# Patient Record
Sex: Male | Born: 1960 | Race: White | Hispanic: No | Marital: Single | Smoking: Current every day smoker
Health system: Southern US, Community
[De-identification: ages and names within clinical notes are randomized; demographics above are authoritative.]

## PROBLEM LIST (undated history)

## (undated) DIAGNOSIS — I10 Essential (primary) hypertension: Secondary | ICD-10-CM

---

## 2005-02-11 ENCOUNTER — Emergency Department (HOSPITAL_COMMUNITY): Admission: EM | Admit: 2005-02-11 | Discharge: 2005-02-11 | Payer: Self-pay | Admitting: Emergency Medicine

## 2017-06-18 ENCOUNTER — Emergency Department (HOSPITAL_BASED_OUTPATIENT_CLINIC_OR_DEPARTMENT_OTHER)
Admission: EM | Admit: 2017-06-18 | Discharge: 2017-06-18 | Disposition: A | Discharge status: Home / Self Care | Payer: Self-pay | Attending: Emergency Medicine | Admitting: Emergency Medicine

## 2017-06-18 ENCOUNTER — Emergency Department (HOSPITAL_BASED_OUTPATIENT_CLINIC_OR_DEPARTMENT_OTHER): Payer: Self-pay

## 2017-06-18 ENCOUNTER — Encounter (HOSPITAL_BASED_OUTPATIENT_CLINIC_OR_DEPARTMENT_OTHER): Payer: Self-pay | Admitting: Emergency Medicine

## 2017-06-18 ENCOUNTER — Other Ambulatory Visit: Payer: Self-pay

## 2017-06-18 DIAGNOSIS — R52 Pain, unspecified: Secondary | ICD-10-CM | POA: Insufficient documentation

## 2017-06-18 DIAGNOSIS — Z79899 Other long term (current) drug therapy: Secondary | ICD-10-CM | POA: Insufficient documentation

## 2017-06-18 DIAGNOSIS — J209 Acute bronchitis, unspecified: Secondary | ICD-10-CM | POA: Insufficient documentation

## 2017-06-18 DIAGNOSIS — F17210 Nicotine dependence, cigarettes, uncomplicated: Secondary | ICD-10-CM | POA: Insufficient documentation

## 2017-06-18 MED ORDER — ALBUTEROL SULFATE HFA 108 (90 BASE) MCG/ACT IN AERS
1.0000 | INHALATION_SPRAY | RESPIRATORY_TRACT | Status: DC
  Administered 2017-06-18: 2 via RESPIRATORY_TRACT
  Filled 2017-06-18: qty 6.7

## 2017-06-18 MED ORDER — ALBUTEROL SULFATE HFA 108 (90 BASE) MCG/ACT IN AERS: 1.0000 | INHALATION_SPRAY | Freq: Four times a day (QID) | RESPIRATORY_TRACT | 0 refills | Status: AC | PRN

## 2017-06-18 MED ORDER — DOXYCYCLINE HYCLATE 100 MG PO CAPS: 100.0000 mg | ORAL_CAPSULE | Freq: Two times a day (BID) | ORAL | 0 refills | Status: DC

## 2017-06-18 MED ORDER — PREDNISONE 10 MG (21) PO TBPK: ORAL_TABLET | Freq: Every day | ORAL | 0 refills | Status: DC

## 2017-06-18 MED FILL — DOXYCYCLINE HYCLATE 100 MG: 100 | 10 days supply | Qty: 20 | Fill #0

## 2017-06-18 MED FILL — predniSONE 10 MG TABS: 10 | 12 days supply | Qty: 42 | Fill #0

## 2017-06-18 NOTE — ED Notes (Signed)
Pt returned from xray

## 2017-06-18 NOTE — ED Notes (Signed)
Patient transported to X-ray 

## 2017-06-18 NOTE — ED Notes (Signed)
ED Provider at bedside. 

## 2017-06-18 NOTE — Discharge Instructions (Signed)
Chest x-ray was normal. Symptoms likely from viral illness possibly influenza.  Given history of cigarette use and wheezing on exam this likely cause bronchitis. Take medications as prescribed.  The main treatment approach for a viral upper respiratory infection and/or influenza is to treat the symptoms, support your immune system and prevent spread of illness. Stay well-hydrated. Rest. Take ibuprofen or Tylenol around the clock to help with associated fevers, sore throat, headaches, generalized body aches and malaise. Use an over-the-counter nasal steroid spray (like Flonase) to help with nasal congestion, runny nose and postnasal drip.  Wash your hands often to prevent spread.  A viral upper respiratory infection and/or influenza typically lasts 5-7 days.  Symptoms resolve slowly.  However, a viral upper respiratory infection can also worsen and progress into pneumonia.  Monitor your symptoms.  Return to the emergency department if your symptoms worsen, persist or if you develop chest pain, increased work of breathing

## 2017-06-18 NOTE — ED Triage Notes (Signed)
Pt having nasal congestion, sneezing, cough, throat irritation, ear pressure since Friday.

## 2017-06-18 NOTE — ED Notes (Signed)
Pt verbalizes understanding of d/c instructions and denies any further needs at this time. 

## 2017-06-18 NOTE — ED Notes (Signed)
Pt c/o general URI symptoms that have gone unrelieved by one dose of each of the various OTC cold medications.  Pt is a heavy smoker as well, is unsure as to exactly his last dose of what medication and at what time.

## 2017-06-18 NOTE — ED Provider Notes (Signed)
[facility] EMERGENCY DEPARTMENT Provider Note   CSN: [id number] Arrival date & time: 06/18/17  1536     History   Chief Complaint Chief Complaint  Patient presents with  . URI    HPI Ronald Ho is a 57 y.o. male history of tobacco abuse is here for evaluation of generalized body aches, nasal congestion, scratchy throat, cough with sputum production for 5 days. Has been taken TheraFlu, Alka-Seltzer for symptoms without relief. Denies fevers or chills, chest pain, shortness of breath, increased work of breathing, wheezing, nausea, vomiting, abdominal pain, flank pain, urinary symptoms, changes in bowel movements. No known sick contacts. He smokes one half pack of cigarettes a day for a long time. Denies previous diagnosis of lung disease such as asthma or COPD. No history of significant immunosuppression.  HPI  No past medical history on file.  There are no active problems to display for this patient.   ** The histories are not reviewed yet. Please review them in the "History" navigator section and refresh this SmartLink.     Home Medications    Prior to Admission medications   Medication Sig Start Date End Date Taking? Authorizing Provider  albuterol (PROVENTIL HFA;VENTOLIN HFA) 108 (90 Base) MCG/ACT inhaler Inhale 1-2 puffs into the lungs every 6 (six) hours as needed for wheezing or shortness of breath. 06/18/17   Liberty HandyGibbons, Jerome Viglione J, PA-C  doxycycline (VIBRAMYCIN) 100 MG capsule Take 1 capsule (100 mg total) by mouth 2 (two) times daily. 06/18/17   Liberty HandyGibbons, Alfonsa Vaile J, PA-C  predniSONE (STERAPRED UNI-PAK 21 TAB) 10 MG (21) TBPK tablet Take by mouth daily. Take 6 tabs by mouth daily  for 2 days, then 5 tabs for 2 days, then 4 tabs for 2 days, then 3 tabs for 2 days, 2 tabs for 2 days, then 1 tab by mouth daily for 2 days 06/18/17   Liberty HandyGibbons, Dashiell Franchino J, PA-C    Family History No family history on file.  Social History Social History   Tobacco Use  . Smoking status:  Current Every Day Smoker  . Smokeless tobacco: Never Used  Substance Use Topics  . Alcohol use: Not on file  . Drug use: Not on file     Allergies   Patient has no known allergies.   Review of Systems Review of Systems  HENT: Positive for congestion, postnasal drip, rhinorrhea and sore throat.   Respiratory: Positive for cough.   Musculoskeletal: Positive for myalgias.  All other systems reviewed and are negative.    Physical Exam Updated Vital Signs BP (!) 145/99 (BP Location: Right Arm)   Pulse 77   Temp 98.2 F (36.8 C) (Oral)   Resp 18   Ht 6\' 4"  (1.93 m)   Wt 76.2 kg (168 lb)   SpO2 100%   BMI 20.45 kg/m   Physical Exam  Constitutional: He is oriented to person, place, and time. He appears well-developed and well-nourished. No distress.  NAD. Nontoxic.  HENT:  Head: Normocephalic and atraumatic.  Right Ear: External ear normal.  Left Ear: External ear normal.  Nose: Nose normal.  Oropharynx is mildly erythematous. Tonsils without hypertrophy, or erythema, exudates. Uvula is midline. Moist membranes. Intranasal mucosa normal. Tympanic members are normal.  Eyes: Conjunctivae and EOM are normal. No scleral icterus.  Neck: Normal range of motion. Neck supple.  No cervical adenopathy  Cardiovascular: Normal rate, regular rhythm, normal heart sounds and intact distal pulses.  No murmur heard. No LE edema or calf tenderness.  Pulmonary/Chest: Effort normal. He has wheezes.  Faint expiratory wheezing to left lower lobe posteriorly. Normal work of breathing.  Abdominal: Soft. There is no tenderness.  Musculoskeletal: Normal range of motion. He exhibits no deformity.  Neurological: He is alert and oriented to person, place, and time.  Skin: Skin is warm and dry. Capillary refill takes less than 2 seconds.  Psychiatric: He has a normal mood and affect. His behavior is normal. Judgment and thought content normal.  Nursing note and vitals reviewed.    ED  Treatments / Results  Labs (all labs ordered are listed, but only abnormal results are displayed) Labs Reviewed - No data to display  EKG  EKG Interpretation None       Radiology Dg Chest 2 View  Result Date: 06/18/2017 CLINICAL DATA:  57 year old male with productive cough wheezing body ache and fatigue for 4 days. Smoker. EXAM: CHEST - 2 VIEW COMPARISON:  Chest radiographs 02/04/2017 and earlier. FINDINGS: Stable large lung volumes. Mediastinal contours are stable and within normal limits. No pneumothorax, pulmonary edema, pleural effusion or confluent pulmonary opacity. Visualized tracheal air column is within normal limits. No acute osseous abnormality identified. Negative visible bowel gas pattern. IMPRESSION: Chronic pulmonary hyperinflation. No acute cardiopulmonary abnormality. Electronically Signed   By: [name] M.D.   On: 06/18/2017 16:34    Procedures Procedures (including critical care time)  Medications Ordered in ED Medications  albuterol (PROVENTIL HFA;VENTOLIN HFA) 108 (90 Base) MCG/ACT inhaler 1-2 puff (2 puffs Inhalation Given 06/18/17 1631)     Initial Impression / Assessment and Plan / ED Course  I have reviewed the triage vital signs and the nursing notes.  Pertinent labs & imaging results that were available during my care of the patient were reviewed by me and considered in my medical decision making (see chart for details).    57 y.o. -year-old male here with URI symptoms and cough x 5 days. On my exam patient is nontoxic appearing, speaking in full sentences, w/o increased WOB. No fever, tachypnea, tachycardia, hypoxia. He has focal wheezing to LLL on exam. CXR without infiltrate or edema. Given long h/o tobacco abuse, cough with sputum production will tx for COPD exacerbation/bronchitis. History and exam not consistent with CHF. Given reassuring physical exam, will discharge with symptomatic treatment, prednisone, albuterol, doxycyline. He is outside tamiflu  window. Strict ED return precautions given. Patient is aware of red flag symptoms to monitor for that would warrant return to the ED for further reevaluation.    Final Clinical Impressions(s) / ED Diagnoses   Final diagnoses:  Acute bronchitis, unspecified organism    ED Discharge Orders        Ordered    predniSONE (STERAPRED UNI-PAK 21 TAB) 10 MG (21) TBPK tablet  Daily     06/18/17 1659    doxycycline (VIBRAMYCIN) 100 MG capsule  2 times daily     06/18/17 1659    albuterol (PROVENTIL HFA;VENTOLIN HFA) 108 (90 Base) MCG/ACT inhaler  Every 6 hours PRN     06/18/17 1659       Liberty Handy, PA-C 06/18/17 1736    Arby Barrette, MD 06/21/17 [phone]

## 2017-10-14 ENCOUNTER — Encounter (HOSPITAL_BASED_OUTPATIENT_CLINIC_OR_DEPARTMENT_OTHER): Payer: Self-pay | Admitting: *Deleted

## 2017-10-14 ENCOUNTER — Emergency Department (HOSPITAL_BASED_OUTPATIENT_CLINIC_OR_DEPARTMENT_OTHER): Payer: Self-pay

## 2017-10-14 ENCOUNTER — Other Ambulatory Visit: Payer: Self-pay

## 2017-10-14 ENCOUNTER — Emergency Department (HOSPITAL_BASED_OUTPATIENT_CLINIC_OR_DEPARTMENT_OTHER)
Admission: EM | Admit: 2017-10-14 | Discharge: 2017-10-14 | Disposition: A | Discharge status: Home / Self Care | Payer: Self-pay | Attending: Emergency Medicine | Admitting: Emergency Medicine

## 2017-10-14 DIAGNOSIS — F17200 Nicotine dependence, unspecified, uncomplicated: Secondary | ICD-10-CM | POA: Insufficient documentation

## 2017-10-14 DIAGNOSIS — R0789 Other chest pain: Secondary | ICD-10-CM | POA: Insufficient documentation

## 2017-10-14 DIAGNOSIS — Z79899 Other long term (current) drug therapy: Secondary | ICD-10-CM | POA: Insufficient documentation

## 2017-10-14 MED ORDER — OXYCODONE-ACETAMINOPHEN 5-325 MG PO TABS
1.0000 | ORAL_TABLET | Freq: Once | ORAL | Status: AC
  Administered 2017-10-14: 1 via ORAL
  Filled 2017-10-14: qty 1

## 2017-10-14 MED ORDER — IBUPROFEN 400 MG PO TABS
600.0000 mg | ORAL_TABLET | Freq: Once | ORAL | Status: AC
  Administered 2017-10-14: 600 mg via ORAL
  Filled 2017-10-14: qty 1

## 2017-10-14 NOTE — Discharge Instructions (Addendum)
Your x-rays are normal. Take ibuprofen 600 mg every 6 hours as needed for pain.

## 2017-10-14 NOTE — ED Provider Notes (Signed)
[facility] EMERGENCY DEPARTMENT Provider Note   CSN: [id number] Arrival date & time: 10/14/17  1916     History   Chief Complaint No chief complaint on file.   HPI Ronald Ho is a 57 y.o. male.  HPI   56yM with L CP. Onset 3d ago. Was blowing nose when he felt a pop in L chest and has had persistent pain since. Pain is in L anterior chest with radiation to back/shoulder. Constant. Worse with pressure in area and with movement. Does not feel SOB. No change in cough. No unusual leg pain or swelling.   History reviewed. No pertinent past medical history.  There are no active problems to display for this patient.   History reviewed. No pertinent surgical history.      Home Medications    Prior to Admission medications   Medication Sig Start Date End Date Taking? Authorizing Provider  albuterol (PROVENTIL HFA;VENTOLIN HFA) 108 (90 Base) MCG/ACT inhaler Inhale 1-2 puffs into the lungs every 6 (six) hours as needed for wheezing or shortness of breath. 06/18/17   Liberty HandyGibbons, Claudia J, PA-C  doxycycline (VIBRAMYCIN) 100 MG capsule Take 1 capsule (100 mg total) by mouth 2 (two) times daily. 06/18/17   Liberty HandyGibbons, Claudia J, PA-C  predniSONE (STERAPRED UNI-PAK 21 TAB) 10 MG (21) TBPK tablet Take by mouth daily. Take 6 tabs by mouth daily  for 2 days, then 5 tabs for 2 days, then 4 tabs for 2 days, then 3 tabs for 2 days, 2 tabs for 2 days, then 1 tab by mouth daily for 2 days 06/18/17   Liberty HandyGibbons, Claudia J, PA-C    Family History No family history on file.  Social History Social History   Tobacco Use  . Smoking status: Current Every Day Smoker  . Smokeless tobacco: Never Used  Substance Use Topics  . Alcohol use: Not on file  . Drug use: Not on file     Allergies   Patient has no known allergies.   Review of Systems Review of Systems  All systems reviewed and negative, other than as noted in HPI.  Physical Exam Updated Vital Signs BP (!) 118/91   Pulse 90    Temp 98.3 F (36.8 C) (Oral)   Resp 20   Ht 6\' 4"  (1.93 m)   Wt 74.8 kg (165 lb)   SpO2 98%   BMI 20.08 kg/m   Physical Exam  Constitutional: He appears well-developed and well-nourished. No distress.  HENT:  Head: Normocephalic and atraumatic.  Eyes: Conjunctivae are normal. Right eye exhibits no discharge. Left eye exhibits no discharge.  Neck: Neck supple.  Cardiovascular: Normal rate, regular rhythm and normal heart sounds. Exam reveals no gallop and no friction rub.  No murmur heard. Pulmonary/Chest: Effort normal and breath sounds normal. No respiratory distress. He exhibits tenderness.  Point tender in pictured area. No crepitus. No overlying skin changes.     Abdominal: Soft. He exhibits no distension. There is no tenderness.  Musculoskeletal: He exhibits no edema or tenderness.  Lower extremities symmetric as compared to each other. No calf tenderness. Negative Homan's. No palpable cords.   Neurological: He is alert.  Skin: Skin is warm and dry.  Psychiatric: He has a normal mood and affect. His behavior is normal. Thought content normal.  Nursing note and vitals reviewed.    ED Treatments / Results  Labs (all labs ordered are listed, but only abnormal results are displayed) Labs Reviewed - No data to display  EKG None  Radiology No results found.  Procedures Procedures (including critical care time)  Medications Ordered in ED Medications - No data to display   Initial Impression / Assessment and Plan / ED Course  I have reviewed the triage vital signs and the nursing notes.  Pertinent labs & imaging results that were available during my care of the patient were reviewed by me and considered in my medical decision making (see chart for details).    56yM with symptoms consistent with chest wall pain. Very reproducible. Atypical for ACS. I doubt PE or dissection. CXR w/o acute findings. Symptomatic tx.   Final Clinical Impressions(s) / ED Diagnoses    Final diagnoses:  Chest wall pain    ED Discharge Orders    None       Raeford Razor, MD 10/21/17 [phone]

## 2017-10-14 NOTE — ED Triage Notes (Signed)
3 days ago he blew his nose and felt a pop in his left ribs and pain in his shoulder.

## 2017-11-27 ENCOUNTER — Other Ambulatory Visit: Payer: Self-pay

## 2017-11-27 ENCOUNTER — Emergency Department (HOSPITAL_BASED_OUTPATIENT_CLINIC_OR_DEPARTMENT_OTHER)
Admission: EM | Admit: 2017-11-27 | Discharge: 2017-11-27 | Disposition: A | Discharge status: Home / Self Care | Payer: Self-pay | Attending: Emergency Medicine | Admitting: Emergency Medicine

## 2017-11-27 ENCOUNTER — Encounter (HOSPITAL_BASED_OUTPATIENT_CLINIC_OR_DEPARTMENT_OTHER): Payer: Self-pay | Admitting: *Deleted

## 2017-11-27 DIAGNOSIS — M792 Neuralgia and neuritis, unspecified: Secondary | ICD-10-CM | POA: Insufficient documentation

## 2017-11-27 DIAGNOSIS — Z79899 Other long term (current) drug therapy: Secondary | ICD-10-CM | POA: Insufficient documentation

## 2017-11-27 DIAGNOSIS — M79644 Pain in right finger(s): Secondary | ICD-10-CM | POA: Insufficient documentation

## 2017-11-27 DIAGNOSIS — I10 Essential (primary) hypertension: Secondary | ICD-10-CM | POA: Insufficient documentation

## 2017-11-27 DIAGNOSIS — F17200 Nicotine dependence, unspecified, uncomplicated: Secondary | ICD-10-CM | POA: Insufficient documentation

## 2017-11-27 HISTORY — DX: Essential (primary) hypertension: I10

## 2017-11-27 MED ORDER — GABAPENTIN 100 MG PO CAPS: 100.0000 mg | ORAL_CAPSULE | Freq: Three times a day (TID) | ORAL | 0 refills | Status: AC

## 2017-11-27 MED FILL — GABAPENTIN 100 MG CAPSULE: 100 | 30 days supply | Qty: 90 | Fill #0

## 2017-11-27 NOTE — Discharge Instructions (Addendum)
The pain you are experiencing is coming from the nerves. You likely have carpal tunnel syndrome. I have prescribed you a medication, Gabapentin (Neurontin), which should help decrease the amount of pain you are having over the next 1-2 weeks. You can also try Tylenol and/or Ibuprofen as well. If you choose to buy wrist braces, wear them at night.  It is important that you establish with a primary care provider who can help manage this for you in the long term. I have placed information for one of the primary care groups in the building.  [facility] [phone]

## 2017-11-27 NOTE — ED Triage Notes (Signed)
Pt reports one month of constant burning pain to his hands and both legs and feet. 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

## 2017-11-27 NOTE — ED Provider Notes (Signed)
[facility] EMERGENCY DEPARTMENT Provider Note  CSN: [id number] Arrival date & time: 11/27/17  1023   History   Chief Complaint Chief Complaint  Patient presents with  . Hand Pain    HPI Ronald Ho is a 57 y.o. male with a medical history of HTN who presented to the ED for bilateral hand pain x1 month. He describes burning, tingling and stinging pain on the entire palmar aspect of both hands that is worse at night. Patient denies any recent injury or trauma to neck or upper body. 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 Patient states that his ROM is intact, but feels like it has been affected because of the pain. Patient has tried nothing prior to coming to the ED. Also complains of foot pain, but reports standing for extended periods of time at his job. Denies neck pain, back pain, gait/coordination/balance issues, arthralgias, skin rashes or tremors.  Past Medical History:  Diagnosis Date  . Hypertension     There are no active problems to display for this patient.   History reviewed. No pertinent surgical history.      Home Medications    Prior to Admission medications   Medication Sig Start Date End Date Taking? Authorizing Provider  albuterol (PROVENTIL HFA;VENTOLIN HFA) 108 (90 Base) MCG/ACT inhaler Inhale 1-2 puffs into the lungs every 6 (six) hours as needed for wheezing or shortness of breath. 06/18/17   Liberty HandyGibbons, Claudia J, PA-C  doxycycline (VIBRAMYCIN) 100 MG capsule Take 1 capsule (100 mg total) by mouth 2 (two) times daily. 06/18/17   Liberty HandyGibbons, Claudia J, PA-C  gabapentin (NEURONTIN) 100 MG capsule Take 1 capsule (100 mg total) by mouth 3 (three) times daily. 11/27/17 12/27/17  Mortis, Jerrel IvoryGabrielle I, PA-C  hydrochlorothiazide (MICROZIDE) 12.5 MG capsule Take by mouth.    [provider]  predniSONE (STERAPRED UNI-PAK 21 TAB) 10 MG (21) TBPK tablet Take by mouth daily. Take 6 tabs by mouth daily  for 2 days, then 5 tabs for 2 days, then 4 tabs for 2  days, then 3 tabs for 2 days, 2 tabs for 2 days, then 1 tab by mouth daily for 2 days 06/18/17   Liberty HandyGibbons, Claudia J, PA-C    Family History History reviewed. No pertinent family history.  Social History Social History   Tobacco Use  . Smoking status: Current Every Day Smoker  . Smokeless tobacco: Never Used  Substance Use Topics  . Alcohol use: Not on file  . Drug use: Not on file     Allergies   Patient has no known allergies.   Review of Systems Review of Systems  Constitutional: Negative for chills and fever.  Genitourinary: Negative.   Musculoskeletal: Negative for back pain, gait problem and neck pain.  Skin: Negative.   Neurological: Positive for numbness. Negative for tremors and weakness.     Physical Exam Updated Vital Signs BP (!) 142/83 (BP Location: Right Arm)   Pulse 70   Temp 98 F (36.7 C) (Oral)   Resp 18   SpO2 99%   Physical Exam  Constitutional: He appears well-developed and well-nourished.  Neck: Normal range of motion and full passive range of motion without pain. Neck supple. No spinous process tenderness and no muscular tenderness present. Normal range of motion present.  Cardiovascular:  Pulses:      Radial pulses are 2+ on the right side, and 2+ on the left side.       Dorsalis pedis pulses are 2+ on  the right side, and 2+ on the left side.  Musculoskeletal: Normal range of motion.  Full active and passive ROM in upper and lower extremities bilaterally with 5/5 strength. No deformities, bony tenderness or muscular tenderness.  Neurological: He is alert. He has normal strength. He displays no atrophy. No sensory deficit. He exhibits normal muscle tone. He displays no seizure activity. Gait normal.  Reflex Scores:      Tricep reflexes are 1+ on the right side and 1+ on the left side.      Bicep reflexes are 1+ on the right side and 1+ on the left side.      Brachioradialis reflexes are 1+ on the right side and 1+ on the left side.       Patellar reflexes are 1+ on the right side and 1+ on the left side.      Achilles reflexes are 1+ on the right side and 1+ on the left side. Skin: Skin is warm and intact. Capillary refill takes less than 2 seconds. No abrasion, no bruising and no rash noted. No erythema.  Nursing note and vitals reviewed.    ED Treatments / Results  Labs (all labs ordered are listed, but only abnormal results are displayed) Labs Reviewed - No data to display  EKG None  Radiology No results found.  Procedures Procedures (including critical care time)  Medications Ordered in ED Medications - No data to display   Initial Impression / Assessment and Plan / ED Course  Triage vital signs and the nursing notes have been reviewed.  Pertinent labs & imaging results that were available during care of the patient were reviewed and considered in medical decision making (see chart for details).   Patient presents with bilateral neuropathic hand pain. History is consistent with a nerve entrapment syndrome, especially given his occupation. Fortunately, patient's strength and objective sensation is intact. There is no hypothenar or thenar atrophy present. Patient does not have any bony tenderness, deformities or history of trauma/injury that would warrant imaging today.  Final Clinical Impressions(s) / ED Diagnoses  1. Neuropathic Hand Pain. Possible carpal tunnel syndrome. Prescribed Neurontin 100mg  TID for treatment. Advised to follow-up with PCP for further management as an outpatient. Will defer neurology referral to PCP if necessary,   Dispo: Home. After thorough clinical evaluation, this patient is determined to be medically stable and can be safely discharged with the previously mentioned treatment and/or outpatient follow-up/referral(s). At this time, there are no other apparent medical conditions that require further screening, evaluation or treatment.   Final diagnoses:  Neuropathic pain    ED  Discharge Orders         Ordered    gabapentin (NEURONTIN) 100 MG capsule  3 times daily     11/27/17 [address]1144            Mortis, Plum CreekGabrielle I, PA-C 11/27/17 1144    Sabas SousBero, Michael M, MD 11/27/17 1446

## 2017-11-28 NOTE — ED Notes (Signed)
11/28/17 250pm-pt presented to ED-requested to RTW Monday 12/02/17-reviewed with Cleatrice BurkeMarva Simms, Charge RN-advised to give pt RTW 11/29/17 and f/u info with Dr Pearletha ForgeHudnall, ortho-done-pt NAD-steady gait

## 2018-05-29 ENCOUNTER — Other Ambulatory Visit: Payer: Self-pay

## 2018-05-29 ENCOUNTER — Encounter (HOSPITAL_BASED_OUTPATIENT_CLINIC_OR_DEPARTMENT_OTHER): Payer: Self-pay | Admitting: *Deleted

## 2018-05-29 ENCOUNTER — Emergency Department (HOSPITAL_BASED_OUTPATIENT_CLINIC_OR_DEPARTMENT_OTHER)
Admission: EM | Admit: 2018-05-29 | Discharge: 2018-05-29 | Disposition: A | Discharge status: Home / Self Care | Payer: PRIVATE HEALTH INSURANCE | Attending: Emergency Medicine | Admitting: Emergency Medicine

## 2018-05-29 DIAGNOSIS — I10 Essential (primary) hypertension: Secondary | ICD-10-CM | POA: Diagnosis not present

## 2018-05-29 DIAGNOSIS — K0889 Other specified disorders of teeth and supporting structures: Secondary | ICD-10-CM | POA: Diagnosis present

## 2018-05-29 DIAGNOSIS — F17210 Nicotine dependence, cigarettes, uncomplicated: Secondary | ICD-10-CM | POA: Diagnosis not present

## 2018-05-29 DIAGNOSIS — K047 Periapical abscess without sinus: Secondary | ICD-10-CM | POA: Diagnosis not present

## 2018-05-29 DIAGNOSIS — Z79899 Other long term (current) drug therapy: Secondary | ICD-10-CM | POA: Diagnosis not present

## 2018-05-29 MED ORDER — LIDOCAINE VISCOUS HCL 2 % MT SOLN: 15.0000 mL | OROMUCOSAL | 0 refills | Status: AC | PRN

## 2018-05-29 MED ORDER — NAPROXEN 500 MG PO TABS: 500.0000 mg | ORAL_TABLET | Freq: Two times a day (BID) | ORAL | 0 refills | Status: AC

## 2018-05-29 MED ORDER — PENICILLIN V POTASSIUM 500 MG PO TABS: 500.0000 mg | ORAL_TABLET | Freq: Three times a day (TID) | ORAL | 0 refills | Status: AC

## 2018-05-29 MED FILL — LIDOCAINE 2% VISCOUS SOLN: 2 | 7 days supply | Qty: 100 | Fill #0

## 2018-05-29 MED FILL — NAPROXEN 500 MG TABLET: 500 | 15 days supply | Qty: 30 | Fill #0

## 2018-05-29 MED FILL — PENICILLIN VK 500 MG TABLET: 500 | 10 days supply | Qty: 30 | Fill #0

## 2018-05-29 NOTE — ED Notes (Signed)
Aromatherapy for pain study initiated.  

## 2018-05-29 NOTE — ED Triage Notes (Signed)
Pt c/o dental pain x 3 days.

## 2018-05-29 NOTE — Discharge Instructions (Addendum)
Take antibiotics as prescribed.  Take the entire course, even if your symptoms improve. Take naproxen 2 times a day with meals.  Do not take other anti-inflammatories at the same time (Advil, Motrin, ibuprofen, Aleve). You may supplement with Tylenol if you need further pain control. Use viscous lidocaine as needed for pain control. Follow-up with your dentist at your scheduled appointment for further evaluation and management of your teeth. Return to the emergency room if you develop difficulty opening your jaw, severe worsening pain, high fevers, or any new, worsening, or concerning symptoms.

## 2018-05-29 NOTE — ED Provider Notes (Signed)
[facility] EMERGENCY DEPARTMENT Provider Note   CSN: [id number] Arrival date & time: 05/29/18  1332     History   Chief Complaint Chief Complaint  Patient presents with  . Dental Pain    HPI Ronald Ho is a 58 y.o. male presenting for evaluation of dental pain.  Patient states that the past 2 to 3 days, he has been having pain of his left lower tooth.  Pain is constant and throbbing.  Nothing makes it better or worse.  He has tried [employer] and peroxide.  He has not tried anything else.  He has an appoint with his dentist next week, but is here today because the pain is intolerable.  He denies fevers, chills, difficulty swallowing.  He denies nausea, vomiting, or abdominal pain.  He denies history of problems with this tooth, but states overall he has poor dentition.  He denies trauma or injury.  He has a history of high blood pressure for which he takes medication, no other medical problems.  He is not immunocompromised.  HPI  Past Medical History:  Diagnosis Date  . Hypertension     There are no active problems to display for this patient.   History reviewed. No pertinent surgical history.      Home Medications    Prior to Admission medications   Medication Sig Start Date End Date Taking? Authorizing Provider  albuterol (PROVENTIL HFA;VENTOLIN HFA) 108 (90 Base) MCG/ACT inhaler Inhale 1-2 puffs into the lungs every 6 (six) hours as needed for wheezing or shortness of breath. 06/18/17   Liberty Handy, PA-C  gabapentin (NEURONTIN) 100 MG capsule Take 1 capsule (100 mg total) by mouth 3 (three) times daily. 11/27/17 12/27/17  Mortis, Jerrel Ivory I, PA-C  hydrochlorothiazide (MICROZIDE) 12.5 MG capsule Take by mouth.    [provider]  lidocaine (XYLOCAINE) 2 % solution Use as directed 15 mLs in the mouth or throat as needed for mouth pain. 05/29/18   Lylla Eifler, PA-C  naproxen (NAPROSYN) 500 MG tablet Take 1 tablet (500 mg total) by mouth 2  (two) times daily with a meal. 05/29/18   Jheremy Boger, PA-C  penicillin v potassium (VEETID) 500 MG tablet Take 1 tablet (500 mg total) by mouth 3 (three) times daily. 05/29/18   Finleigh Cheong, PA-C    Family History History reviewed. No pertinent family history.  Social History Social History   Tobacco Use  . Smoking status: Current Every Day Smoker    Packs/day: 0.50  . Smokeless tobacco: Never Used  Substance Use Topics  . Alcohol use: Not Currently  . Drug use: Not Currently     Allergies   Patient has no known allergies.   Review of Systems Review of Systems  Constitutional: Negative for fever.  HENT: Positive for dental problem.      Physical Exam Updated Vital Signs BP 122/89   Pulse 98   Temp 98.7 F (37.1 C) (Oral)   Resp 18   Ht 6\' 2"  (1.88 m)   Wt 74.8 kg   SpO2 99%   BMI 21.18 kg/m   Physical Exam Vitals signs and nursing note reviewed.  Constitutional:      General: He is not in acute distress.    Appearance: He is well-developed.  HENT:     Head: Normocephalic and atraumatic.     Mouth/Throat:     Dentition: Abnormal dentition. Dental caries present.      Comments: Very poor dentition.  Almost all teeth  with dental caries, and missing multiple teeth.  Tenderness palpation of the left lower tooth.  Surrounding gum erythema and edema.  No tenderness palpation under the tongue.  No trismus.  No facial swelling or swelling in the neck. Neck:     Musculoskeletal: Normal range of motion.  Cardiovascular:     Rate and Rhythm: Normal rate and regular rhythm.     Pulses: Normal pulses.  Pulmonary:     Effort: Pulmonary effort is normal.  Abdominal:     General: There is no distension.  Musculoskeletal: Normal range of motion.  Skin:    General: Skin is warm.     Findings: No rash.  Neurological:     Mental Status: He is alert and oriented to person, place, and time.      ED Treatments / Results  Labs (all labs ordered are  listed, but only abnormal results are displayed) Labs Reviewed - No data to display  EKG None  Radiology No results found.  Procedures Procedures (including critical care time)  Medications Ordered in ED Medications - No data to display   Initial Impression / Assessment and Plan / ED Course  I have reviewed the triage vital signs and the nursing notes.  Pertinent labs & imaging results that were available during my care of the patient were reviewed by me and considered in my medical decision making (see chart for details).     Patient presenting for evaluation of dental pain.  Physical exam reassuring, he is afebrile nontoxic.  I am not consistent with Ludwig's.  Considering new onset pain and overall poor dentition, will treat for a dental infection with penicillin and pain control.  Encouraged follow-up with dentist as planned.  At this time, patient appears safe for discharge.  Return precautions given.  Patient states he understands and agrees plan.   Final Clinical Impressions(s) / ED Diagnoses   Final diagnoses:  Pain, dental  Dental infection    ED Discharge Orders         Ordered    penicillin v potassium (VEETID) 500 MG tablet  3 times daily     05/29/18 1500    naproxen (NAPROSYN) 500 MG tablet  2 times daily with meals     05/29/18 1500    lidocaine (XYLOCAINE) 2 % solution  As needed     05/29/18 1500           Latarshia Jersey, PA-C 05/29/18 1505    Vanetta Mulders, MD 06/03/18 [phone]

## 2021-01-03 ENCOUNTER — Other Ambulatory Visit: Payer: Self-pay

## 2021-01-03 ENCOUNTER — Emergency Department (HOSPITAL_BASED_OUTPATIENT_CLINIC_OR_DEPARTMENT_OTHER)
Admission: EM | Admit: 2021-01-03 | Discharge: 2021-01-04 | Disposition: A | Discharge status: Home / Self Care | Payer: Self-pay | Attending: Emergency Medicine | Admitting: Emergency Medicine

## 2021-01-03 ENCOUNTER — Encounter (HOSPITAL_BASED_OUTPATIENT_CLINIC_OR_DEPARTMENT_OTHER): Payer: Self-pay | Admitting: *Deleted

## 2021-01-03 DIAGNOSIS — F17210 Nicotine dependence, cigarettes, uncomplicated: Secondary | ICD-10-CM | POA: Insufficient documentation

## 2021-01-03 DIAGNOSIS — E876 Hypokalemia: Secondary | ICD-10-CM | POA: Insufficient documentation

## 2021-01-03 DIAGNOSIS — R202 Paresthesia of skin: Secondary | ICD-10-CM

## 2021-01-03 DIAGNOSIS — M542 Cervicalgia: Secondary | ICD-10-CM | POA: Insufficient documentation

## 2021-01-03 DIAGNOSIS — I10 Essential (primary) hypertension: Secondary | ICD-10-CM | POA: Insufficient documentation

## 2021-01-03 NOTE — ED Notes (Signed)
Pt. Reports he gets a burning and tingle sensation all over with a hot feeling over his head sometimes and it hurts like hell per Pt.    Pt  reports it starts late evening some times.  Pt. Reports no loss of speech or any thing,  No loss of urine, walking seems to be ok.

## 2021-01-03 NOTE — ED Triage Notes (Signed)
2 weeks of numbness and tingling in his fingers. 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 Denies arthritis. Past 4 days tingling and burning all over his body.

## 2021-01-04 ENCOUNTER — Emergency Department (HOSPITAL_BASED_OUTPATIENT_CLINIC_OR_DEPARTMENT_OTHER): Payer: Self-pay

## 2021-01-04 LAB — CBC WITH DIFFERENTIAL/PLATELET
Abs Immature Granulocytes: 0.02 10*3/uL (ref 0.00–0.07)
Basophils Absolute: 0.1 10*3/uL (ref 0.0–0.1)
Basophils Relative: 1 %
Eosinophils Absolute: 0.1 10*3/uL (ref 0.0–0.5)
Eosinophils Relative: 1 %
HCT: 41.5 % (ref 39.0–52.0)
Hemoglobin: 15 g/dL (ref 13.0–17.0)
Immature Granulocytes: 0 %
Lymphocytes Relative: 19 %
Lymphs Abs: 1.6 10*3/uL (ref 0.7–4.0)
MCH: 34.6 pg — ABNORMAL HIGH (ref 26.0–34.0)
MCHC: 36.1 g/dL — ABNORMAL HIGH (ref 30.0–36.0)
MCV: 95.6 fL (ref 80.0–100.0)
Monocytes Absolute: 1 10*3/uL (ref 0.1–1.0)
Monocytes Relative: 12 %
Neutro Abs: 5.7 10*3/uL (ref 1.7–7.7)
Neutrophils Relative %: 67 %
Platelets: 248 10*3/uL (ref 150–400)
RBC: 4.34 MIL/uL (ref 4.22–5.81)
RDW: 12.8 % (ref 11.5–15.5)
Smear Review: NORMAL
WBC: 8.4 10*3/uL (ref 4.0–10.5)
nRBC: 0 % (ref 0.0–0.2)

## 2021-01-04 LAB — COMPREHENSIVE METABOLIC PANEL
ALT: 13 U/L (ref 0–44)
AST: 39 U/L (ref 15–41)
Albumin: 4.5 g/dL (ref 3.5–5.0)
Alkaline Phosphatase: 64 U/L (ref 38–126)
Anion gap: 11 (ref 5–15)
BUN: 10 mg/dL (ref 6–20)
CO2: 27 mmol/L (ref 22–32)
Calcium: 9.6 mg/dL (ref 8.9–10.3)
Chloride: 92 mmol/L — ABNORMAL LOW (ref 98–111)
Creatinine, Ser: 1.11 mg/dL (ref 0.61–1.24)
GFR, Estimated: >60 mL/min (ref >60)
Glucose, Bld: 100 mg/dL — ABNORMAL HIGH (ref 70–99)
Potassium: 3.2 mmol/L — ABNORMAL LOW (ref 3.5–5.1)
Sodium: 130 mmol/L — ABNORMAL LOW (ref 135–145)
Total Bilirubin: 1.3 mg/dL — ABNORMAL HIGH (ref 0.3–1.2)
Total Protein: 7.5 g/dL (ref 6.5–8.1)

## 2021-01-04 LAB — MAGNESIUM: Magnesium: 1.7 mg/dL (ref 1.7–2.4)

## 2021-01-04 MED ORDER — POTASSIUM CHLORIDE CRYS ER 20 MEQ PO TBCR: 40.0000 meq | EXTENDED_RELEASE_TABLET | Freq: Every day | ORAL | 0 refills | Status: AC

## 2021-01-04 MED ORDER — METHOCARBAMOL 500 MG PO TABS: 500.0000 mg | ORAL_TABLET | Freq: Three times a day (TID) | ORAL | 0 refills | Status: AC | PRN

## 2021-01-04 NOTE — Discharge Instructions (Signed)
You were seen in emergency room today with tingling and pain.  Have called in a muscle relaxer to help with symptoms when they develop but this can cause drowsiness.  Your potassium is slightly low and I am replacing that with potassium supplements for the next 5 days.  I have listed the name of a primary care doctor.  Please return to the emergency department with any new or suddenly worsening symptoms.

## 2021-01-04 NOTE — ED Provider Notes (Signed)
Emergency Department Provider Note   I have reviewed the triage vital signs and the nursing notes.   HISTORY  Chief Complaint Tingling   HPI Ronald Ho is a 60 y.o. male with PMH of HTN is emergency room with painful tingling.  Patient states that he has had intermittent tingling in the hands for a [name] time but over the past several days and especially last night he has had "tingling and pain all over."  He does tell me that his pain seems to start in the hands but also the neck.  He states it then travels throughout his body and feels like pins/needles sensation.  He tells me last night he was unable to sleep with this pain.  I did resolve spontaneously later in the morning. Denies any unilateral numbness/weakness. No HA. No vision change. No radiation of symptoms or modifying factors.    Past Medical History:  Diagnosis Date   Hypertension     There are no problems to display for this patient.   History reviewed. No pertinent surgical history.  Allergies Patient has no known allergies.  No family history on file.  Social History Social History   Tobacco Use   Smoking status: Every Day    Packs/day: 0.50    Types: Cigarettes   Smokeless tobacco: Never  Substance Use Topics   Alcohol use: Yes   Drug use: Yes    Types: Marijuana    Review of Systems  Constitutional: No fever/chills. Positive tingling and pain all over (intermittent).  Eyes: No visual changes. ENT: No sore throat. Cardiovascular: Denies chest pain. Respiratory: Denies shortness of breath. Gastrointestinal: No abdominal pain.  No nausea, no vomiting.  No diarrhea.  No constipation. Genitourinary: Negative for dysuria. Musculoskeletal: Negative for back pain. Intermittent neck pain.  Skin: Negative for rash. Neurological: Negative for headaches, focal weakness or numbness.  10-point ROS otherwise negative.  ____________________________________________   PHYSICAL EXAM:  VITAL  SIGNS: ED Triage Vitals  Enc Vitals Group     BP 01/03/21 2249 (!) 174/99     Pulse Rate 01/03/21 2249 (!) 121     Resp 01/03/21 2249 18     Temp 01/03/21 2249 98.2 F (36.8 C)     Temp Source 01/03/21 2249 Oral     SpO2 01/03/21 2249 100 %     Weight 01/03/21 2250 165 lb (74.8 kg)     Height 01/03/21 2250 6\' 2"  (1.88 m)   Constitutional: Alert and oriented. Well appearing and in no acute distress. Eyes: Conjunctivae are normal.  Head: Atraumatic. Nose: No congestion/rhinnorhea. Mouth/Throat: Mucous membranes are moist.  Oropharynx non-erythematous. Neck: No stridor. No cervical spine tenderness to palpation. Cardiovascular: Normal rate, regular rhythm. Good peripheral circulation. Grossly normal heart sounds.   Respiratory: Normal respiratory effort.  No retractions. Lungs CTAB. Gastrointestinal: Soft and nontender. No distention.  Musculoskeletal: No lower extremity tenderness nor edema. No gross deformities of extremities. Neurologic:  Normal speech and language. No gross focal neurologic deficits are appreciated. No facial asymmetry. 5/5 strength in the bilateral upper/lower extremities. Normal sensation throughout.  Skin:  Skin is warm, dry and intact. No rash noted.   ____________________________________________   LABS (all labs ordered are listed, but only abnormal results are displayed)  Labs Reviewed  COMPREHENSIVE METABOLIC PANEL - Abnormal; Notable for the following components:      Result Value   Sodium 130 (*)    Potassium 3.2 (*)    Chloride 92 (*)    Glucose, Bld  100 (*)    Total Bilirubin 1.3 (*)    All other components within normal limits  CBC WITH DIFFERENTIAL/PLATELET - Abnormal; Notable for the following components:   MCH 34.6 (*)    MCHC 36.1 (*)    All other components within normal limits  MAGNESIUM   ____________________________________________  RADIOLOGY  CT Cervical Spine Wo Contrast  Result Date: 01/04/2021 CLINICAL DATA:  Chronic neck  pain with degenerative changes on x-ray. Two week history of numbness and tingling in the fingers. EXAM: CT CERVICAL SPINE WITHOUT CONTRAST TECHNIQUE: Multidetector CT imaging of the cervical spine was performed without intravenous contrast. Multiplanar CT image reconstructions were also generated. COMPARISON:  None. FINDINGS: Alignment: Normal. Skull base and vertebrae: No acute fracture. No primary bone lesion or focal pathologic process. Soft tissues and spinal canal: No prevertebral fluid or swelling. No visible canal hematoma. Disc levels: Mild degenerative changes in the cervical spine. Narrowed interspaces and endplate osteophyte formation present at C4-5, C5-6, and C6-7 levels. Minimal bone encroachment upon the neural foramina. There is suggestion of a possible left paracentral disc osteophyte complex at C4-5 and C5-6 levels causing effacement of the left lateral recess. Upper chest: Mild emphysematous changes suggested in the lung apices. Other: None. IMPRESSION: Normal alignment of the cervical spine. No acute displaced fractures. Mild degenerative changes. Electronically Signed   By: Burman Nieves M.D.   On: 01/04/2021 00:41    ____________________________________________   PROCEDURES  Procedure(s) performed:   Procedures  None  ____________________________________________   INITIAL IMPRESSION / ASSESSMENT AND PLAN / ED COURSE  Pertinent labs & imaging results that were available during my care of the patient were reviewed by me and considered in my medical decision making (see chart for details).   Patient presents emergency department with total body tingling and pain which is intermittent.  He has no focal deficits to suspect stroke.  Some symptoms do seem to radiate from the neck. CT imaging of the cervical spine with mild degenerative changes but no acute fractures or other abnormal alignment. No deficits to prompt emergent MRI. Labs with mild hypokalemia. Plan for K  replacement and have referred to PCP. Will try symptom mgmt. History seems less suggestive of neuropathy given intermittent and pain throughout the body. Discussed ED return precautions.    ____________________________________________  FINAL CLINICAL IMPRESSION(S) / ED DIAGNOSES  Final diagnoses:  Paresthesias  Hypokalemia     NEW OUTPATIENT MEDICATIONS STARTED DURING THIS VISIT:  Discharge Medication List as of 01/04/2021  2:04 AM     START taking these medications   Details  methocarbamol (ROBAXIN) 500 MG tablet Take 1 tablet (500 mg total) by mouth every 8 (eight) hours as needed., Starting Wed 01/04/2021, Normal    potassium chloride SA (KLOR-CON) 20 MEQ tablet Take 2 tablets (40 mEq total) by mouth daily for 5 days., Starting Wed 01/04/2021, Until Mon 01/09/2021, Normal        Note:  This document was prepared using Dragon voice recognition software and may include unintentional dictation errors.  Alona Bene, MD, [facility]    Shia Delaine, Arlyss Repress, MD 01/04/21 [phone]

## 2022-05-12 IMAGING — CT CT CERVICAL SPINE W/O CM
4 of 6 series · 13 of 33 positions shown, 15 images · non-contrast
Comparison: None.

CLINICAL DATA: Chronic neck pain with degenerative changes on
x-ray. Two week history of numbness and tingling in the fingers.

EXAM:
CT CERVICAL SPINE WITHOUT CONTRAST
TECHNIQUE: Multidetector CT imaging of the cervical spine was performed without
intravenous contrast. Multiplanar CT image reconstructions were also
generated.

[Series 3: c spine soft · axial · 0.40mm/px · z∈[+84,+130]mm · 2 of 92 slices shown]
[im 23/92  soft-tissue]
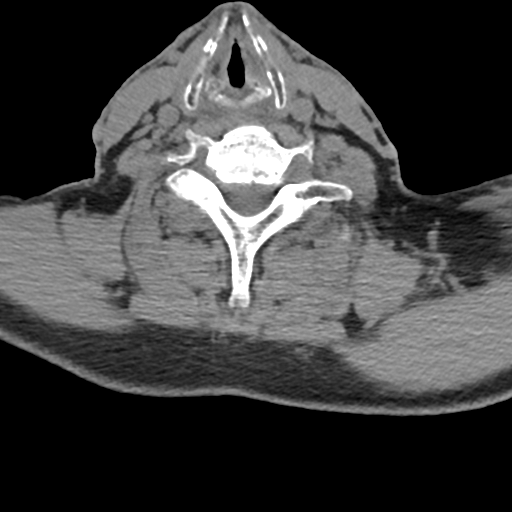
[im 46/92  soft-tissue]
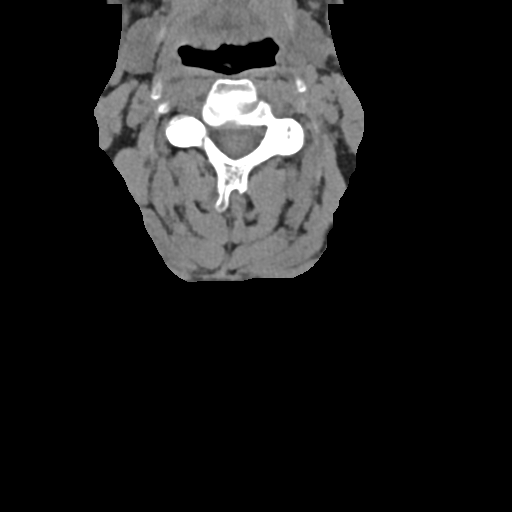

[Series 5: sagittal bone · sagittal · 0.28mm/px · 5 of 61 slices shown, 6 images]
[im 21/61  bone]
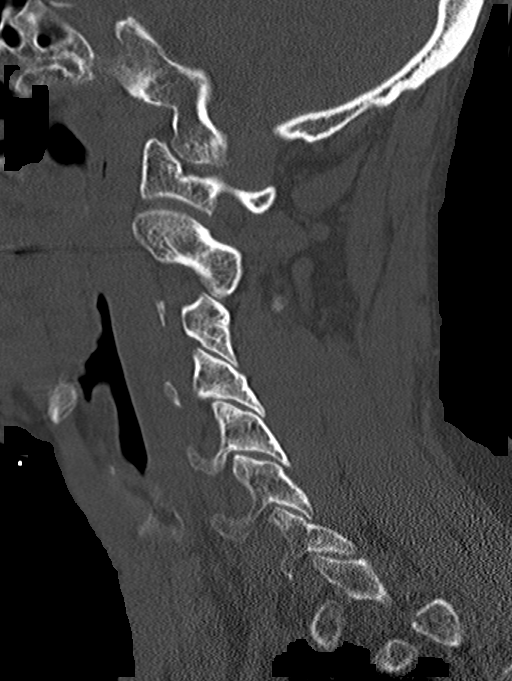
[im 26/61  bone]
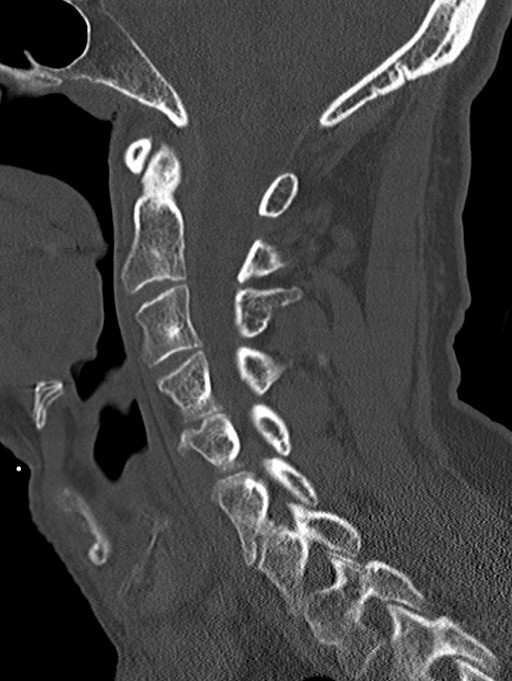
[im 31/61  soft-tissue]
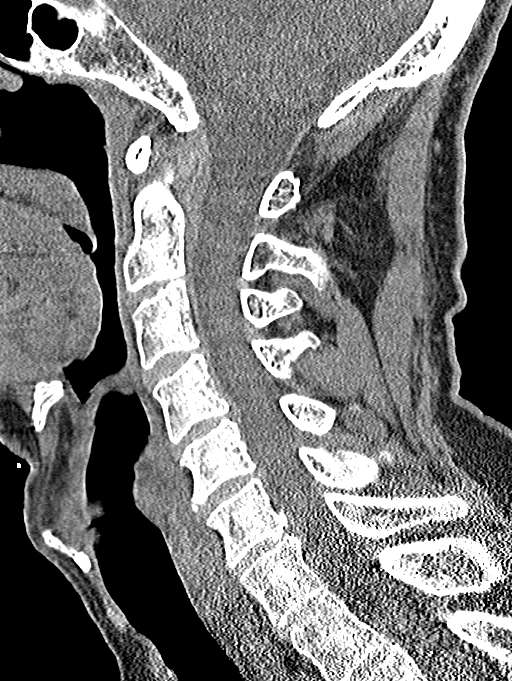
[im 31/61  bone]
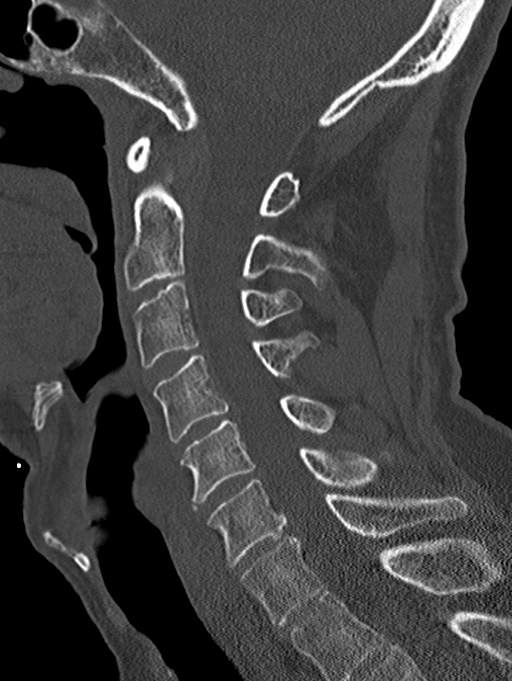
[im 36/61  bone]
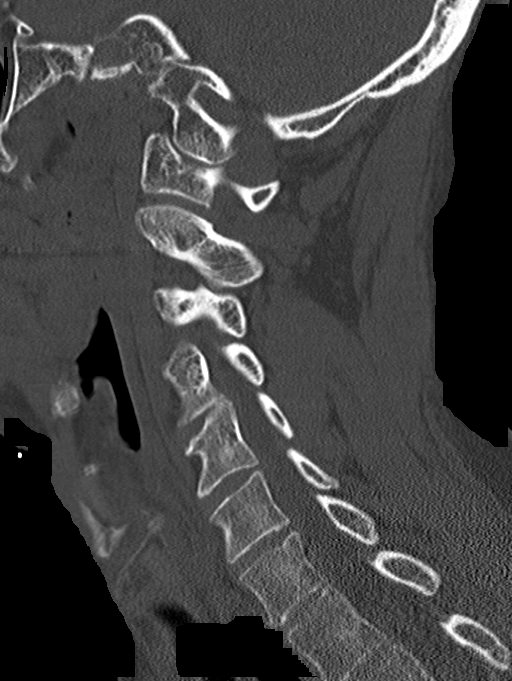
[im 41/61  bone]
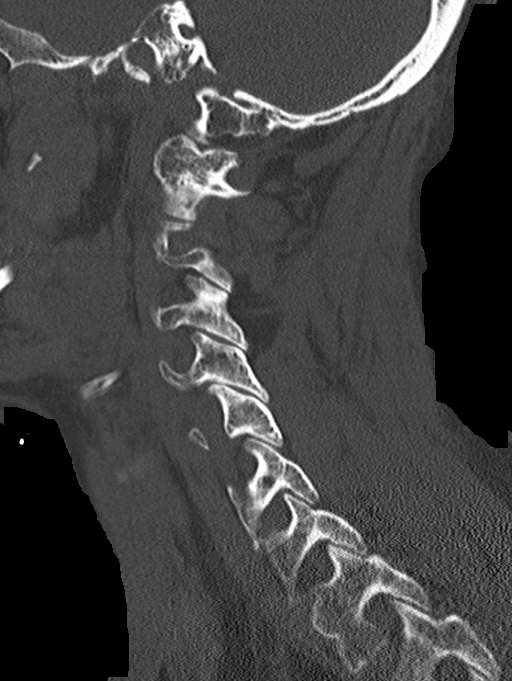

[Series 6: coronal bone · coronal · 0.28mm/px · 3 of 61 slices shown]
[im 13/61  bone]
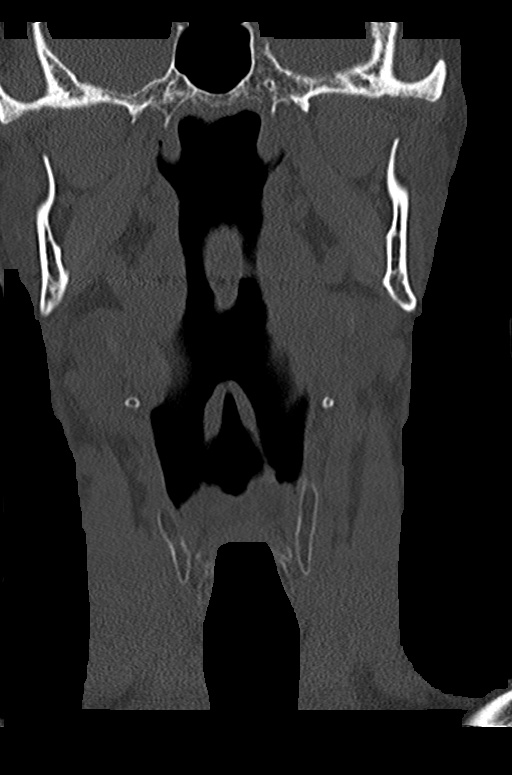
[im 25/61  bone]
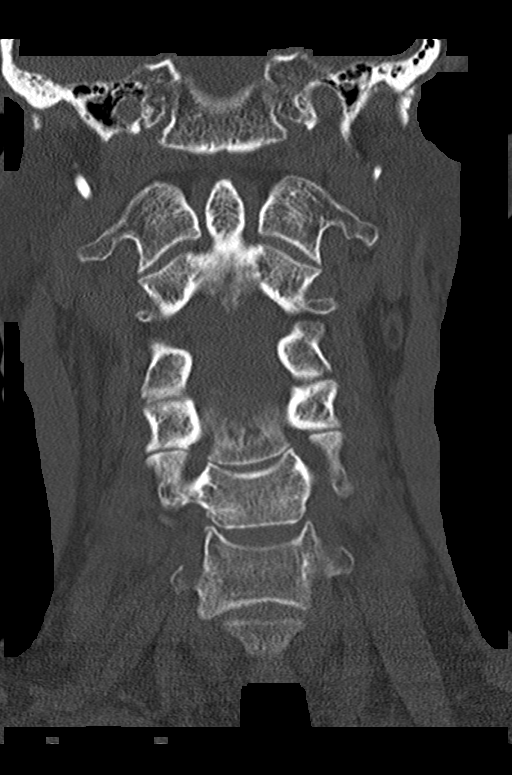
[im 37/61  bone]
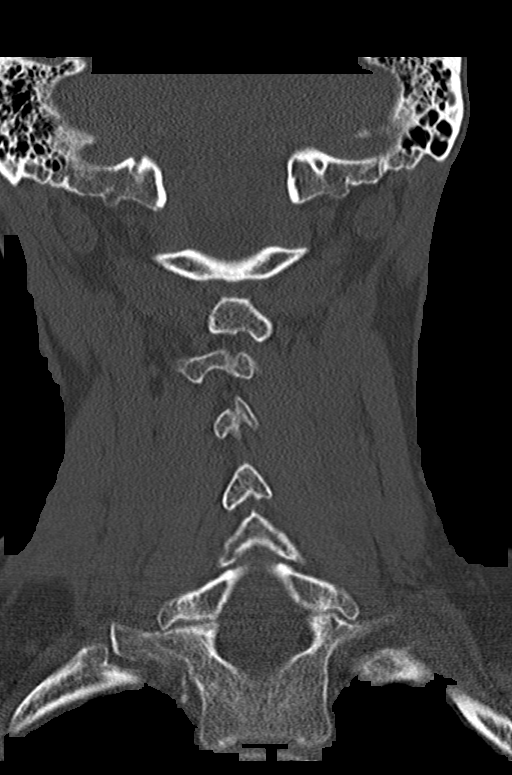

[Series 7: orthogonal bone · axial · 0.18mm/px · z∈[+104,+226]mm · 3 of 62 slices shown, 4 images]
[im 1/62  soft-tissue]
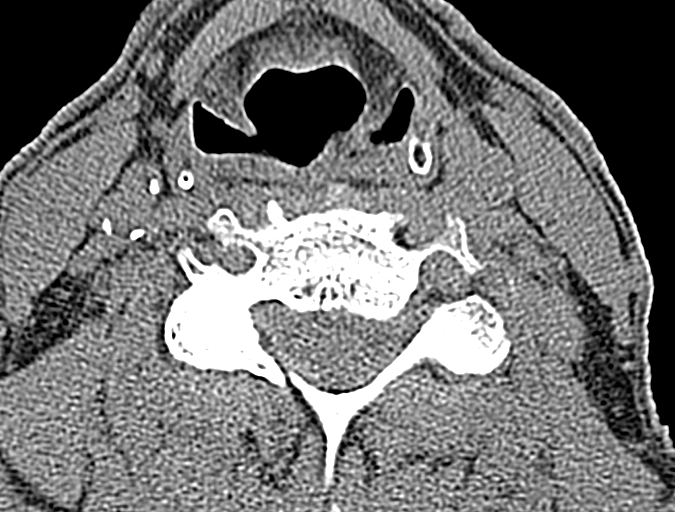
[im 1/62  bone]
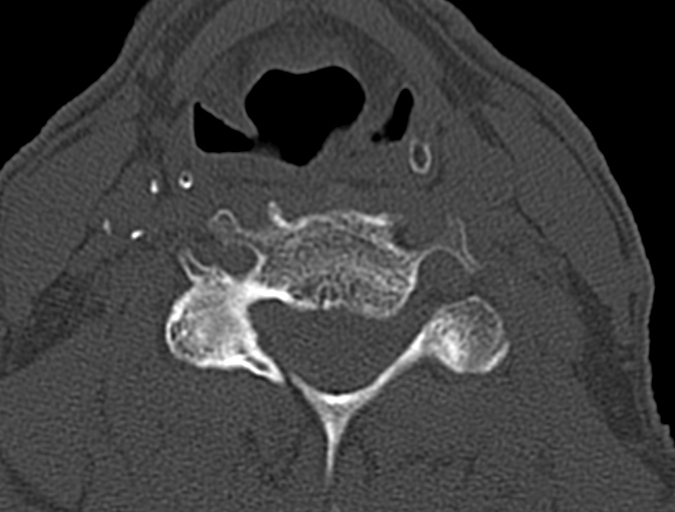
[im 31/62  bone]
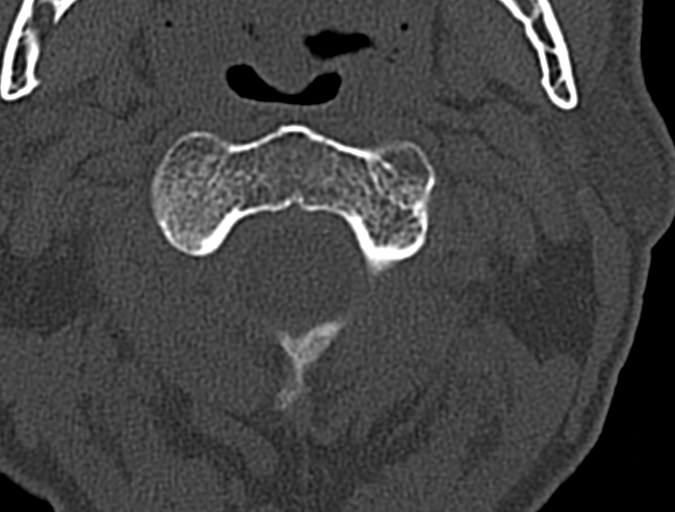
[im 62/62  bone]
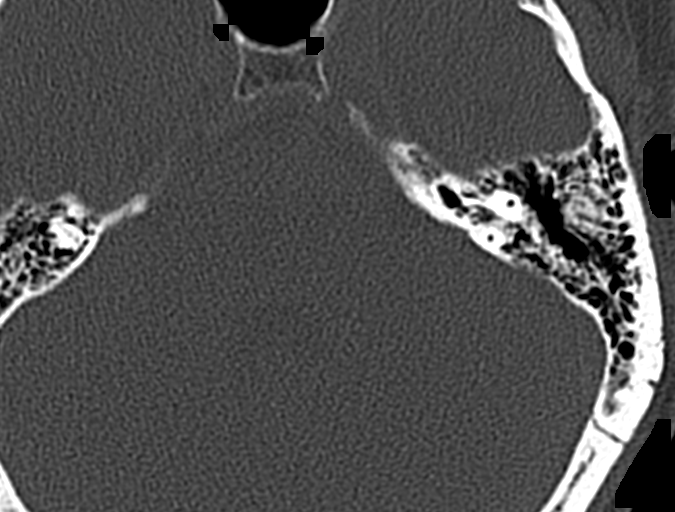

[13 of 33 positions shown; findings below may reference images not displayed]

FINDINGS: Alignment: Normal.

Skull base and vertebrae: No acute fracture. No primary bone lesion
or focal pathologic process.

Soft tissues and spinal canal: No prevertebral fluid or swelling. No
visible canal hematoma.

Disc levels: Mild degenerative changes in the cervical spine.
Narrowed interspaces and endplate osteophyte formation present at
C4-5, C5-6, and C6-7 levels. Minimal bone encroachment upon the
neural foramina. There is suggestion of a possible left paracentral
disc osteophyte complex at C4-5 and C5-6 levels causing effacement
of the left lateral recess.

Upper chest: Mild emphysematous changes suggested in the lung
apices.

Other: None.
IMPRESSION: Normal alignment of the cervical spine. No acute displaced
fractures. Mild degenerative changes.

## 2022-10-18 ENCOUNTER — Other Ambulatory Visit (HOSPITAL_COMMUNITY): Payer: Self-pay

## 2022-10-19 ENCOUNTER — Other Ambulatory Visit (HOSPITAL_COMMUNITY): Payer: Self-pay

## 2022-11-03 ENCOUNTER — Other Ambulatory Visit (HOSPITAL_COMMUNITY): Payer: Self-pay
